# Patient Record
Sex: Male | Born: 2000 | Race: White | Hispanic: No | Marital: Single | Smoking: Never smoker
Health system: Southern US, Community
[De-identification: ages and names within clinical notes are randomized; demographics above are authoritative.]

---
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
## 2021-04-13 ENCOUNTER — Other Ambulatory Visit (HOSPITAL_BASED_OUTPATIENT_CLINIC_OR_DEPARTMENT_OTHER): Payer: Self-pay

## 2021-04-13 MED ORDER — AMOXICILLIN 875 MG PO TABS
ORAL_TABLET | ORAL | 0 refills | Status: AC
  Filled 2021-04-13: qty 6, 3d supply, fill #0

## 2021-04-13 MED ORDER — DEXAMETHASONE 4 MG PO TABS
ORAL_TABLET | ORAL | 0 refills | Status: AC
  Filled 2021-04-13: qty 9, 3d supply, fill #0

## 2021-04-13 MED ORDER — KETOROLAC TROMETHAMINE 10 MG PO TABS
ORAL_TABLET | ORAL | 0 refills | Status: AC
  Filled 2021-04-13: qty 20, 5d supply, fill #0
